# Patient Record
Sex: Male | Born: 1975 | Race: White | Hispanic: No | Marital: Single | Smoking: Never smoker
Health system: Southern US, Community
[De-identification: ages and names within clinical notes are randomized; demographics above are authoritative.]

## PROBLEM LIST (undated history)

## (undated) DIAGNOSIS — I10 Essential (primary) hypertension: Secondary | ICD-10-CM

## (undated) DIAGNOSIS — G43909 Migraine, unspecified, not intractable, without status migrainosus: Secondary | ICD-10-CM

## (undated) HISTORY — PX: JOINT REPLACEMENT: SHX530

## (undated) HISTORY — PX: HIP SURGERY: SHX245

## (undated) HISTORY — PX: CHOLECYSTECTOMY: SHX55

## (undated) HISTORY — PX: ADENOIDECTOMY: SUR15

---

## 2020-04-06 ENCOUNTER — Other Ambulatory Visit: Payer: Self-pay

## 2020-04-06 ENCOUNTER — Emergency Department (HOSPITAL_COMMUNITY)
Admission: EM | Admit: 2020-04-06 | Discharge: 2020-04-06 | Disposition: A | Discharge status: Home / Self Care | Payer: Self-pay | Attending: Emergency Medicine | Admitting: Emergency Medicine

## 2020-04-06 ENCOUNTER — Emergency Department (HOSPITAL_COMMUNITY): Payer: Self-pay

## 2020-04-06 ENCOUNTER — Encounter (HOSPITAL_COMMUNITY): Payer: Self-pay

## 2020-04-06 DIAGNOSIS — W231XXA Caught, crushed, jammed, or pinched between stationary objects, initial encounter: Secondary | ICD-10-CM | POA: Insufficient documentation

## 2020-04-06 DIAGNOSIS — I10 Essential (primary) hypertension: Secondary | ICD-10-CM | POA: Insufficient documentation

## 2020-04-06 DIAGNOSIS — M25532 Pain in left wrist: Secondary | ICD-10-CM | POA: Insufficient documentation

## 2020-04-06 DIAGNOSIS — Y9289 Other specified places as the place of occurrence of the external cause: Secondary | ICD-10-CM | POA: Insufficient documentation

## 2020-04-06 HISTORY — DX: Essential (primary) hypertension: I10

## 2020-04-06 NOTE — ED Triage Notes (Signed)
Pt states he injured left hand at work last Thursday- states it got stuck btwn 2 palettes, no obvious injury or swelling noted- small bump on top of hand. Pt denies numbness or tingling in fingers, work sent him here to be evaluated

## 2020-04-06 NOTE — Discharge Instructions (Addendum)
Continue taking Tylenol ibuprofen as needed at home. Use ice to help with pain and swelling. Use the splint/brace as needed for support, especially if you are using your hand a lot at work. Follow-up with your HR/employee health for further evaluation.  They may want to send you to somebody in particular, but if not, you may follow-up with orthopedic doctor listed below. Return to emergency room for new, worsening, concerning symptoms.

## 2020-04-06 NOTE — Progress Notes (Signed)
Orthopedic Tech Progress Note Patient Details:  Paul Howell 08/25/1975 [id number]  Ortho Devices Type of Ortho Device: Velcro wrist splint Ortho Device/Splint Location: left Ortho Device/Splint Interventions: Application   Post Interventions Patient Tolerated: Well Instructions Provided: Care of device   Saul Fordyce 04/06/2020, 9:22 AM

## 2020-04-06 NOTE — ED Provider Notes (Signed)
[facility]-EMERGENCY DEPT Provider Note   CSN: [id number] Arrival date & time: 04/06/20  0017     History Chief Complaint  Patient presents with  . Hand Pain    Paul Howell is a 45 y.o. male presenting for evaluation of left hand pain and swelling.  Patient states 5 days ago his left hand got stuck between 2 pallets.  He reports acute onset of pain.  Since then, he has had minimal pain when at home resting, but worsening pain when he is at work using his hand and wrist.  Pain is mostly in the dorsal part of his hand/wrist.  No numbness or tingling in his fingers.  No pain in his elbow.  He is taking ibuprofen with minimal improvement of symptoms, has not tried anything else.  He is not on blood thinners.  No injury elsewhere.  As he continued to have severe pain after working last night, he was sent to the ER for evaluation.  He does not have an orthopedic or hand surgeon that he sees in [place].  HPI     Past Medical History:  Diagnosis Date  . Hypertension     There are no problems to display for this patient.   Past Surgical History:  Procedure Laterality Date  . JOINT REPLACEMENT         No family history on file.  Social History   Tobacco Use  . Smoking status: Never Smoker  . Smokeless tobacco: Never Used  Substance Use Topics  . Alcohol use: Not Currently  . Drug use: Never    Home Medications Prior to Admission medications   Not on File    Allergies    Patient has no known allergies.  Review of Systems   Review of Systems  Musculoskeletal: Positive for arthralgias and joint swelling.  Neurological: Negative for numbness.    Physical Exam Updated Vital Signs BP (!) 151/94 (BP Location: Left Arm)   Pulse 70   Temp 97.7 F (36.5 C) (Oral)   Resp 18   Ht 6\' 1"  (1.854 m)   Wt 122.5 kg   SpO2 99%   BMI 35.62 kg/m   Physical Exam Vitals and nursing note reviewed.  Constitutional:      General: He is not  in acute distress.    Appearance: He is well-developed.  HENT:     Head: Normocephalic and atraumatic.  Pulmonary:     Effort: Pulmonary effort is normal.  Abdominal:     General: There is no distension.  Musculoskeletal:        General: Swelling and tenderness present. Normal range of motion.     Cervical back: Normal range of motion.     Comments: Minimal swelling noted of the dorsal left hand at the base of the metacarpals and over the carpals.  No contusion or erythema of the skin.  Patient with full active range of motion of the wrist with minimal pain and full active range of motion of all fingers with pain.  Good distal sensation and cap refill of all fingers.  No pain at the anatomic snuffbox.  No pain of the forearm or elbow.  Radial pulse 2+.  Skin:    General: Skin is warm.     Findings: No rash.  Neurological:     Mental Status: He is alert and oriented to person, place, and time.     ED Results / Procedures / Treatments   Labs (all labs ordered are  listed, but only abnormal results are displayed) Labs Reviewed - No data to display  EKG None  Radiology DG Wrist Complete Left  Result Date: 04/06/2020 CLINICAL DATA:  Injury.  Pain and swelling. EXAM: LEFT WRIST - COMPLETE 3+ VIEW COMPARISON:  No prior. FINDINGS: Radiocarpal degenerative change. Tiny corticated well-circumscribed cysts noted in the distal ulna and lunate, these may be degenerative. Mild cortical thickening noted of the left fifth metacarpal, possibly related to old injury. No acute abnormality. No evidence of acute fracture or dislocation. IMPRESSION: Degenerative changes left wrist. No acute bony or joint abnormality. Electronically Signed   By: Maisie Fus  Register   On: 04/06/2020 08:47   DG Hand Complete Left  Result Date: 04/06/2020 CLINICAL DATA:  Pain and swelling after hand caught between heavy objects EXAM: LEFT HAND - COMPLETE 3+ VIEW COMPARISON:  None. FINDINGS: Frontal, oblique, and lateral views  were obtained. No fracture or dislocation. Joint spaces appear unremarkable. No erosive changes. IMPRESSION: No fracture or dislocation.  No evident arthropathy. Electronically Signed   By: Bretta Bang III M.D.   On: 04/06/2020 08:46    Procedures Procedures   Medications Ordered in ED Medications - No data to display  ED Course  I have reviewed the triage vital signs and the nursing notes.  Pertinent labs & imaging results that were available during my care of the patient were reviewed by me and considered in my medical decision making (see chart for details).    MDM Rules/Calculators/A&P                          Patient presented for evaluation of left hand pain and swelling.  On exam, patient appears nontoxic.  He is neurovascular intact.  While incident occurred several days ago, he continues to have pain, as such, obtain x-ray to ensure no fracture dislocation, although likely MSK based on exam.  X-rays viewed interpreted by me, no fracture dislocation.  Discussed findings with patient.  Discussed continued treatment with NSAIDs and Tylenol.  Will place in for splint to help with pain.  Encourage follow-up if symptoms are not improving.  At this time, patient appears safe for discharge.  Return precautions given.  Patient states he understands and agrees to plan.  Final Clinical Impression(s) / ED Diagnoses Final diagnoses:  None    Rx / DC Orders ED Discharge Orders    None       Alveria Apley, PA-C 04/06/20 0920    Linwood Dibbles, MD 04/07/20 [phone]

## 2020-05-03 ENCOUNTER — Encounter (HOSPITAL_COMMUNITY): Payer: Self-pay

## 2020-05-03 ENCOUNTER — Emergency Department (HOSPITAL_COMMUNITY)
Admission: EM | Admit: 2020-05-03 | Discharge: 2020-05-03 | Disposition: A | Discharge status: Home / Self Care | Payer: Self-pay | Attending: Emergency Medicine | Admitting: Emergency Medicine

## 2020-05-03 ENCOUNTER — Other Ambulatory Visit: Payer: Self-pay

## 2020-05-03 ENCOUNTER — Emergency Department (HOSPITAL_COMMUNITY): Payer: Self-pay

## 2020-05-03 DIAGNOSIS — I10 Essential (primary) hypertension: Secondary | ICD-10-CM | POA: Insufficient documentation

## 2020-05-03 DIAGNOSIS — R5383 Other fatigue: Secondary | ICD-10-CM | POA: Insufficient documentation

## 2020-05-03 DIAGNOSIS — R42 Dizziness and giddiness: Secondary | ICD-10-CM | POA: Insufficient documentation

## 2020-05-03 DIAGNOSIS — H539 Unspecified visual disturbance: Secondary | ICD-10-CM | POA: Insufficient documentation

## 2020-05-03 DIAGNOSIS — R519 Headache, unspecified: Secondary | ICD-10-CM | POA: Insufficient documentation

## 2020-05-03 HISTORY — DX: Migraine, unspecified, not intractable, without status migrainosus: G43.909

## 2020-05-03 LAB — CBC WITH DIFFERENTIAL/PLATELET
Abs Immature Granulocytes: 0.01 10*3/uL (ref 0.00–0.07)
Basophils Absolute: 0.1 10*3/uL (ref 0.0–0.1)
Basophils Relative: 1 %
Eosinophils Absolute: 0.3 10*3/uL (ref 0.0–0.5)
Eosinophils Relative: 4 %
HCT: 45.3 % (ref 39.0–52.0)
Hemoglobin: 15 g/dL (ref 13.0–17.0)
Immature Granulocytes: 0 %
Lymphocytes Relative: 41 %
Lymphs Abs: 2.6 10*3/uL (ref 0.7–4.0)
MCH: 30.3 pg (ref 26.0–34.0)
MCHC: 33.1 g/dL (ref 30.0–36.0)
MCV: 91.5 fL (ref 80.0–100.0)
Monocytes Absolute: 0.4 10*3/uL (ref 0.1–1.0)
Monocytes Relative: 6 %
Neutro Abs: 3 10*3/uL (ref 1.7–7.7)
Neutrophils Relative %: 48 %
Platelets: 224 10*3/uL (ref 150–400)
RBC: 4.95 MIL/uL (ref 4.22–5.81)
RDW: 12.3 % (ref 11.5–15.5)
WBC: 6.3 10*3/uL (ref 4.0–10.5)
nRBC: 0 % (ref 0.0–0.2)

## 2020-05-03 LAB — RAPID URINE DRUG SCREEN, HOSP PERFORMED
Amphetamines: NOT DETECTED
Barbiturates: NOT DETECTED
Benzodiazepines: NOT DETECTED
Cocaine: NOT DETECTED
Opiates: NOT DETECTED
Tetrahydrocannabinol: NOT DETECTED

## 2020-05-03 LAB — URINALYSIS, ROUTINE W REFLEX MICROSCOPIC
Bilirubin Urine: NEGATIVE
Glucose, UA: NEGATIVE mg/dL
Hgb urine dipstick: NEGATIVE
Ketones, ur: NEGATIVE mg/dL
Leukocytes,Ua: NEGATIVE
Nitrite: NEGATIVE
Protein, ur: NEGATIVE mg/dL
Specific Gravity, Urine: 1.017 (ref 1.005–1.030)
pH: 7 (ref 5.0–8.0)

## 2020-05-03 LAB — COMPREHENSIVE METABOLIC PANEL
ALT: 20 U/L (ref 0–44)
AST: 15 U/L (ref 15–41)
Albumin: 4.6 g/dL (ref 3.5–5.0)
Alkaline Phosphatase: 60 U/L (ref 38–126)
Anion gap: 7 (ref 5–15)
BUN: 23 mg/dL — ABNORMAL HIGH (ref 6–20)
CO2: 28 mmol/L (ref 22–32)
Calcium: 9.6 mg/dL (ref 8.9–10.3)
Chloride: 102 mmol/L (ref 98–111)
Creatinine, Ser: 0.93 mg/dL (ref 0.61–1.24)
GFR, Estimated: >60 mL/min (ref >60)
Glucose, Bld: 114 mg/dL — ABNORMAL HIGH (ref 70–99)
Potassium: 4.3 mmol/L (ref 3.5–5.1)
Sodium: 137 mmol/L (ref 135–145)
Total Bilirubin: 0.5 mg/dL (ref 0.3–1.2)
Total Protein: 8.1 g/dL (ref 6.5–8.1)

## 2020-05-03 LAB — ETHANOL: Alcohol, Ethyl (B): <10 mg/dL (ref <10)

## 2020-05-03 MED ORDER — KETOROLAC TROMETHAMINE 30 MG/ML IJ SOLN
30.0000 mg | Freq: Once | INTRAMUSCULAR | Status: DC
  Filled 2020-05-03: qty 1

## 2020-05-03 MED ORDER — KETOROLAC TROMETHAMINE 60 MG/2ML IM SOLN
60.0000 mg | Freq: Once | INTRAMUSCULAR | Status: AC
  Administered 2020-05-03: 60 mg via INTRAMUSCULAR
  Filled 2020-05-03: qty 2

## 2020-05-03 MED ORDER — METOCLOPRAMIDE HCL 5 MG/ML IJ SOLN
10.0000 mg | Freq: Once | INTRAMUSCULAR | Status: DC
  Filled 2020-05-03: qty 2

## 2020-05-03 MED ORDER — PROCHLORPERAZINE EDISYLATE 10 MG/2ML IJ SOLN
10.0000 mg | Freq: Once | INTRAMUSCULAR | Status: AC
  Administered 2020-05-03: 10 mg via INTRAMUSCULAR
  Filled 2020-05-03: qty 2

## 2020-05-03 MED ORDER — DIPHENHYDRAMINE HCL 50 MG/ML IJ SOLN
25.0000 mg | Freq: Once | INTRAMUSCULAR | Status: AC
  Administered 2020-05-03: 25 mg via INTRAMUSCULAR
  Filled 2020-05-03: qty 1

## 2020-05-03 MED ORDER — LORAZEPAM 2 MG/ML IJ SOLN
1.0000 mg | Freq: Once | INTRAMUSCULAR | Status: DC | PRN
Start: 2020-05-03 — End: 2020-05-04

## 2020-05-03 MED ORDER — DIPHENHYDRAMINE HCL 50 MG/ML IJ SOLN
25.0000 mg | Freq: Once | INTRAMUSCULAR | Status: DC
  Filled 2020-05-03: qty 1

## 2020-05-03 NOTE — ED Provider Notes (Signed)
[facility]-EMERGENCY DEPT Provider Note   CSN: [id number] Arrival date & time: 05/03/20  1333     History Chief Complaint  Patient presents with  . Near Syncope    Paul Howell is a 45 y.o. male.  HPI Patient reports he had several episodes of a visual anomaly that started about 2 weeks ago.  He reports in association with this he generally experiences the lightheadedness sensation and then sometimes extreme fatigue.  Today the episode was followed by a frontal headache.  Patient reports about 2 weeks ago he was at work and he experienced this phenomenon whereby his vision seemed to be almost stuttering like a camera shutter.  He reports it was not as if he had double vision, it was more like the vision was almost coming and going very rapidly.  He did not prevent him from actually seeing.  He reports the first time it happened he just tried to work through it and it lasted for almost an hour.  He reports he felt lightheaded during the episode.  He did not go on to have a syncopal episode.  He reports once it stopped, all the symptoms were resolved.  Second time it happened, the visual phenomenon was fairly brief, 1 to 2 minutes, then he had some lightheadedness for a while followed by some really significant fatigue.  Again, once his symptoms resolved they were completely gone.  He reports today he experienced the visual phenomenon again and again this was just for a few minutes, followed by extreme fatigue and frontal headache.  Patient does report that about 2 weeks ago just before the onset of the symptoms, he had a fentanyl overdose.  He reports he was told there was a period of time that he was probably anoxic.  He had to have a couple rounds of Narcan.  He is not sure if this is associated with the problem he is experiencing.  He reports now he feels like the symptoms are better if he breathes deeply and brings in more oxygen.  Patient has not had any drug use  since the overdose.  He is not drinking any alcohol.    Past Medical History:  Diagnosis Date  . Hypertension   . Migraine     There are no problems to display for this patient.   Past Surgical History:  Procedure Laterality Date  . ADENOIDECTOMY    . CHOLECYSTECTOMY    . HIP SURGERY Left   . JOINT REPLACEMENT         Family History  Problem Relation Age of Onset  . Hypertension Mother   . Heart attack Father     Social History   Tobacco Use  . Smoking status: Never Smoker  . Smokeless tobacco: Never Used  Vaping Use  . Vaping Use: Never used  Substance Use Topics  . Alcohol use: Not Currently  . Drug use: Never    Home Medications Prior to Admission medications   Not on File    Allergies    Patient has no known allergies.  Review of Systems   Review of Systems 10 systems reviewed and negative except as per HPI Physical Exam Updated Vital Signs BP 139/84   Pulse (!) 53   Temp 97.6 F (36.4 C) (Oral)   Resp 17   Ht 6\' 1"  (1.854 m)   Wt 122.5 kg   SpO2 98%   BMI 35.62 kg/m   Physical Exam Constitutional:  Comments: Alert, nontoxic clinically well in appearance.  No respiratory distress.  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  This has a about 1 cm larger, soft tumor on the lower lip.  There is some involvement of the tongue with asymmetric appearance.    Ears:     Comments: Right TM is narrow, angulated and cerumen impacted.  Left TM small amount of cerumen TM only partially visualized.    Mouth/Throat:     Comments: Dentition is in poor condition with significant decay.  Patient has a tongue deviation to the left with asymmetric size of the tongue reported as baseline with associated birthmark.  Voice is clear.  Posterior oropharynx is patent. Eyes:     Extraocular Movements: Extraocular movements  intact.     Conjunctiva/sclera: Conjunctivae normal.     Pupils: Pupils are equal, round, and reactive to light.     Comments: Eyes normal appearance.  Extraocular motions intact.  Symmetric pupillary responses with normal consensual response.  No periorbital swelling or injection.  Cardiovascular:     Rate and Rhythm: Normal rate.  Pulmonary:     Effort: Pulmonary effort is normal.     Breath sounds: Normal breath sounds.  Abdominal:     General: There is no distension.     Palpations: Abdomen is soft.  Musculoskeletal:        General: No swelling or tenderness. Normal range of motion.     Cervical back: Neck supple.     Right lower leg: No edema.     Left lower leg: No edema.  Skin:    General: Skin is warm and dry.  Neurological:     General: No focal deficit present.     Mental Status: He is oriented to person, place, and time.     Cranial Nerves: No cranial nerve deficit.     Motor: No weakness.     Coordination: Coordination normal.     Comments: Cognitive function and speech are normal.  CS 15.  Finger-nose exam normal bilaterally.  Motor strength 5\5.  Psychiatric:        Mood and Affect: Mood normal.     ED Results / Procedures / Treatments   Labs (all labs ordered are listed, but only abnormal results are displayed) Labs Reviewed  COMPREHENSIVE METABOLIC PANEL - Abnormal; Notable for the following components:      Result Value   Glucose, Bld 114 (*)    BUN 23 (*)    All other components within normal limits  CBC WITH DIFFERENTIAL/PLATELET  ETHANOL  URINALYSIS, ROUTINE W REFLEX MICROSCOPIC  RAPID URINE DRUG SCREEN, HOSP PERFORMED    EKG None  Radiology CT Head Wo Contrast  Result Date: 05/03/2020 CLINICAL DATA:  Headache, new or worsening, positional. Additional history provided: Patient reports headache, dizziness, vision issues, symptoms for 2 weeks. EXAM: CT HEAD WITHOUT CONTRAST TECHNIQUE: Contiguous axial images were obtained from the base of the skull  through the vertex without intravenous contrast. COMPARISON:  No pertinent prior exams available for comparison. FINDINGS: Brain: Cerebral volume is normal. There is no acute intracranial hemorrhage. No demarcated cortical infarct. No extra-axial fluid collection. No evidence of intracranial mass. No midline shift. Vascular: No hyperdense vessel. Skull: Normal. Negative for fracture or focal lesion. Sinuses/Orbits: Visualized orbits show no acute finding. Mild-to-moderate mucosal  thickening within the inferior frontal sinuses bilaterally. Scattered frothy secretions and mild to moderate mucosal thickening within the bilateral ethmoid air cells. Mild bilateral sphenoid sinus mucosal thickening. Small volume frothy secretions and moderate mucosal thickening within the right maxillary sinus. Small volume frothy secretions and mild mucosal thickening within the left maxillary sinus. Other: Small left mastoid effusion. IMPRESSION: No evidence of acute intracranial abnormality. Pansinusitis, as described. Small left mastoid effusion. Electronically Signed   By: Jackey Loge DO   On: 05/03/2020 16:11    Procedures Procedures   Medications Ordered in ED Medications  LORazepam (ATIVAN) injection 1 mg (has no administration in time range)  ketorolac (TORADOL) injection 60 mg (60 mg Intramuscular Given 05/03/20 1737)  prochlorperazine (COMPAZINE) injection 10 mg (10 mg Intramuscular Given 05/03/20 1739)  diphenhydrAMINE (BENADRYL) injection 25 mg (25 mg Intramuscular Given 05/03/20 1738)    ED Course  I have reviewed the triage vital signs and the nursing notes.  Pertinent labs & imaging results that were available during my care of the patient were reviewed by me and considered in my medical decision making (see chart for details).  Clinical Course as of 05/03/20 Glory Buff May 03, 2020  1758 Consult: Reviewed with Dr. Otelia Limes, recommends MRI with and without contrast.  If negative, follow-up with neurology on  outpatient basis [MP]    Clinical Course User Index [MP] Arby Barrette, MD   MDM Rules/Calculators/A&P                          Recheck 18: 50.  Patient reports feeling much better.  Symptoms are resolved after migraine cocktail.  We reviewed the recommendations for MRI.  At this time MRI is not available at [facility] and would require transfer to [facility].  Discussion with Dr. Otelia Limes indicated this could also be done on outpatient basis.  Patient reports he would prefer to follow-up with neurology and pursue outpatient work-up rather than be transferred to [facility] for further imaging at this time.  He reports symptoms are resolved and he feels much better  Patient presented as outlined.  At this time differential diagnosis includes partial seizure, possibly secondary to anoxic injury at time of patient's overdose.  Also possible migraine.  Patient ports he does have history of migraine headaches.  This could represent ocular migraine with visual symptoms then fatigue and today frontal headache.  Patient is clinically well in appearance.  No signs of infectious etiology.  Symptoms resolved completely once the episode is over.  He does not have residual symptoms fevers chills body aches etc. at this time patient stable for discharge with follow-up plan in place.  He is counseled on the nature of seizure disorder and the necessity for no driving and avoiding all injuries that could result in injury if the seizure occurred.  Patient voices understanding. Final Clinical Impression(s) / ED Diagnoses Final diagnoses:  Visual disturbance  Frontal headache  Dizziness    Rx / DC Orders ED Discharge Orders         Ordered    Ambulatory referral to Neurology       Comments: An appointment is requested in approximately: 1 WEEK   05/03/20 1850           Arby Barrette, MD 05/03/20 Windell Moment

## 2020-05-03 NOTE — Discharge Instructions (Signed)
1.  Follow instructions for possible seizure.  At this time it is unclear if you are having any seizure activity.  You may be having a partial seizure.  No driving or any activities that could result in serious injury if you have a seizure.  This includes things such as climbing on ladders, operating equipment, swimming or other activities that could result in serious injury. 2.  Migraines can have ocular symptoms.  Some migraines have symptoms in the vision without headache.  This may be a migraine.  Try to get plenty of rest at night, stay hydrated, eat healthy, small meals and avoid all drug and alcohol use. 3.  Return to the emergency department if you have any persisting, new or worsening symptoms.  Schedule your follow-up with [facility] as soon as possible.  Must have a follow-up with a neurologist for further evaluation.

## 2020-05-03 NOTE — ED Triage Notes (Signed)
Patient states that he has had 2-3 episodes in he past week that he has had near syncopal episodes. Patient states he "has flashing in his eyes, feeling like he is going to pass out and a headache."  Patient states he has been taking Lisinopril 20 mg daily, but has not had any in a month.

## 2020-05-03 NOTE — ED Notes (Addendum)
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 Pt states that these "episodes" he is having, started after that.

## 2020-05-03 NOTE — ED Triage Notes (Signed)
Emergency Medicine Provider Triage Evaluation Note  Betzalel Umbarger , a 45 y.o. male  was evaluated in triage.  Pt complains of generalized weakness, frontal headache, flashes of lights (described as like a camera shutter going off), at this time reports frontal headache, no unilateral weakness or numbness.  Review of Systems  Positive: Headaches, weakness, visual disturbance  Negative: Numbness, speech or gait disturbance  Physical Exam  BP (!) 134/95 (BP Location: Left Arm)   Pulse 69   Temp 97.6 F (36.4 C) (Oral)   Resp 16   Ht 6\' 1"  (1.854 m)   Wt 122.5 kg   SpO2 99%   BMI 35.62 kg/m  Gen:   Awake, no distress   HEENT:  Atraumatic  Resp:  Normal effort  Cardiac:  Normal rate  Abd:   Nondistended, nontender  MSK:   Moves extremities without difficulty  Neuro:  Speech clear   Medical Decision Making  Medically screening exam initiated at 2:32 PM.  Appropriate orders placed.  was informed that the remainder of the evaluation will be completed by another provider, this initial triage assessment does not replace that evaluation, and the importance of remaining in the ED until their evaluation is complete.  Clinical Impression     Amedeo Kinsman, PA-C 05/03/20 1433

## 2021-12-04 IMAGING — CT CT HEAD W/O CM
4 series · 17 of 47 positions shown, 19 images · non-contrast
Comparison: No pertinent prior exams available for comparison.

CLINICAL DATA: Headache, new or worsening, positional. Additional
history provided: Patient reports headache, dizziness, vision
issues, symptoms for 2 weeks.

EXAM:
CT HEAD WITHOUT CONTRAST
TECHNIQUE: Contiguous axial images were obtained from the base of the skull
through the vertex without intravenous contrast.

[Series 2: head wo · axial · 0.47mm/px · z∈[+1648,+1783]mm · 7 of 37 slices shown, 9 images]
[im 5/37  brain]
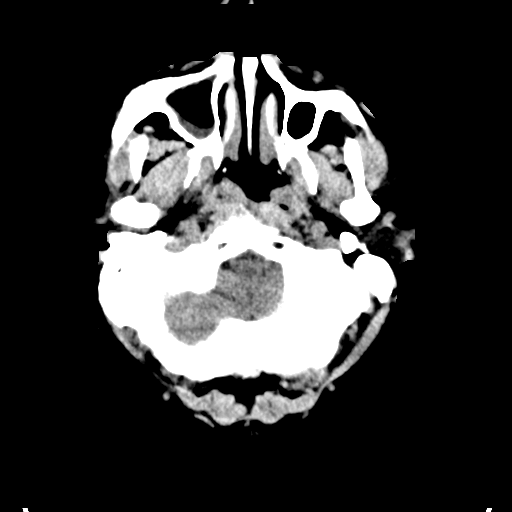
[im 5/37  bone]
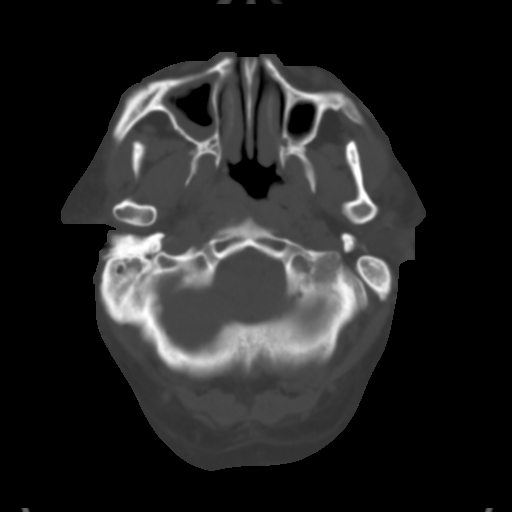
[im 10/37  brain]
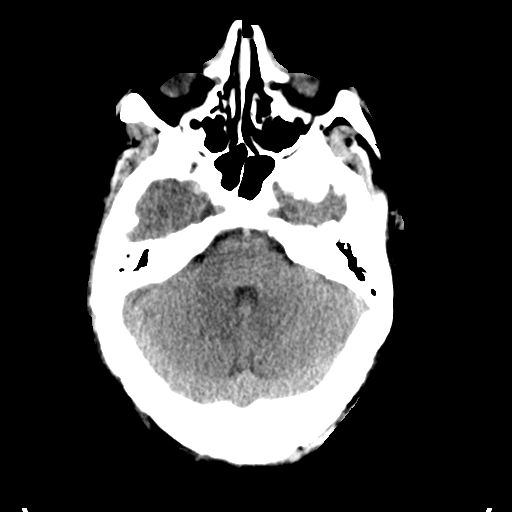
[im 14/37  brain]
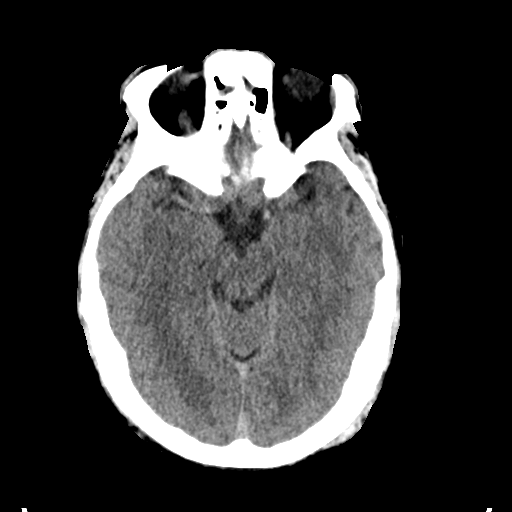
[im 19/37  brain]
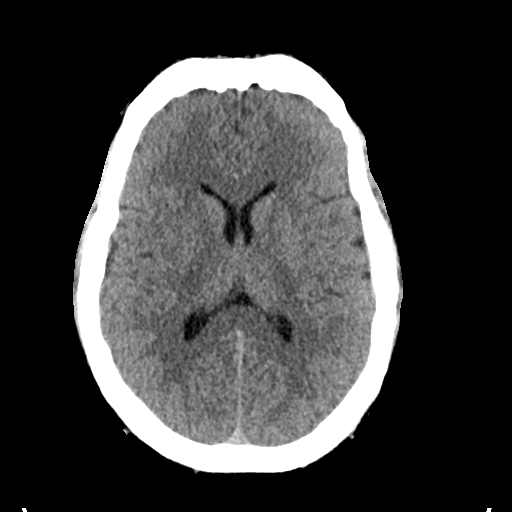
[im 23/37  brain]
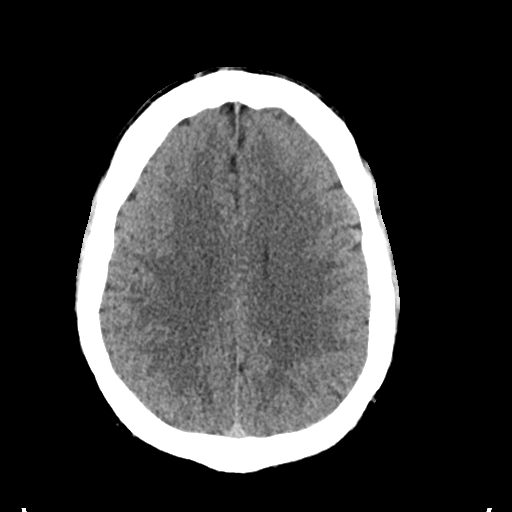
[im 23/37  bone]
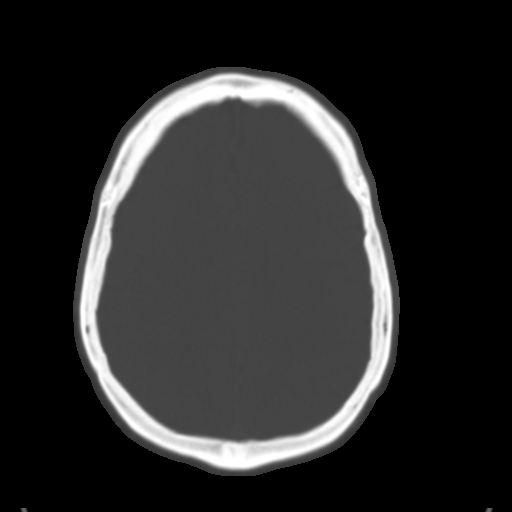
[im 28/37  brain]
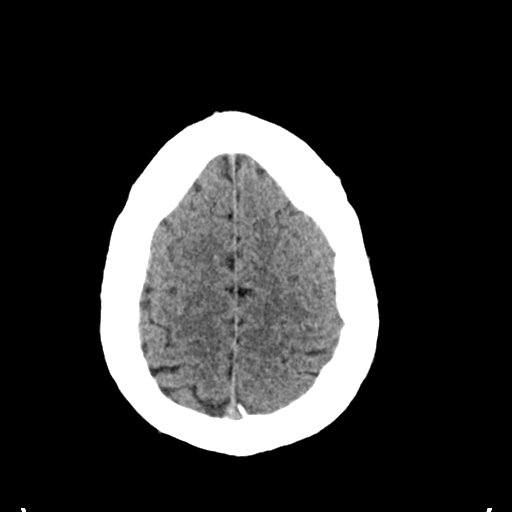
[im 32/37  brain]
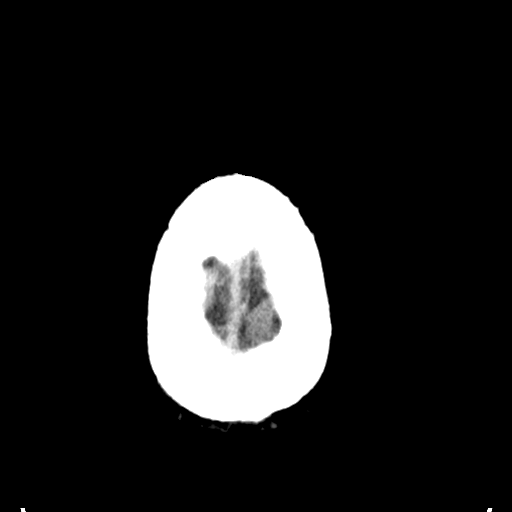

[Series 3: head bone · axial · 0.47mm/px · z∈[+1646,+1708]mm · 4 of 91 slices shown]
[im 10/91  bone]
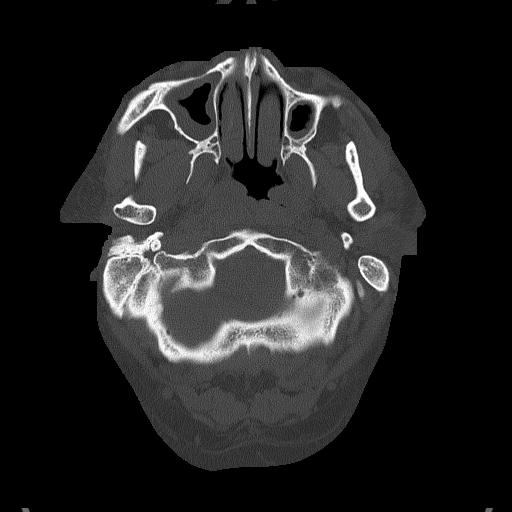
[im 19/91  bone]
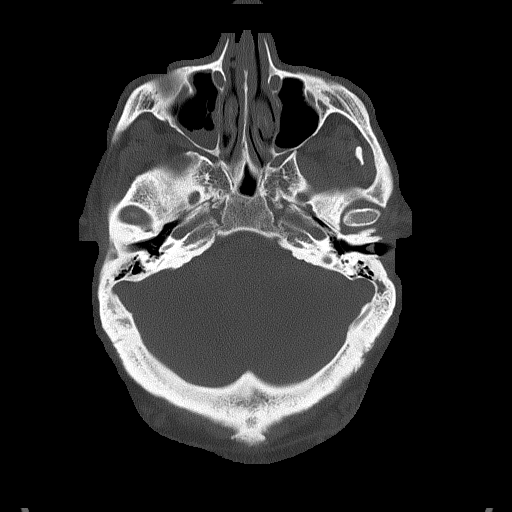
[im 28/91  bone]
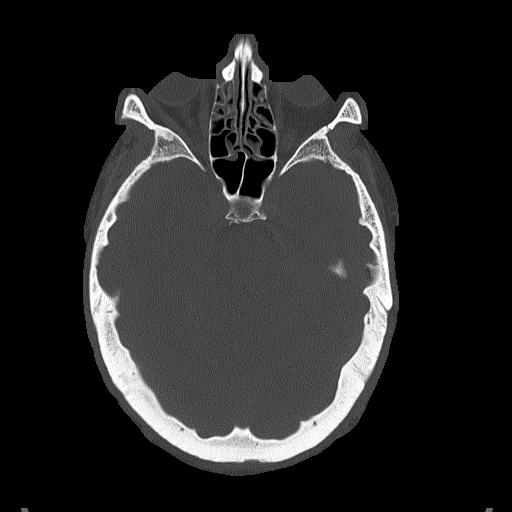
[im 41/91  bone]
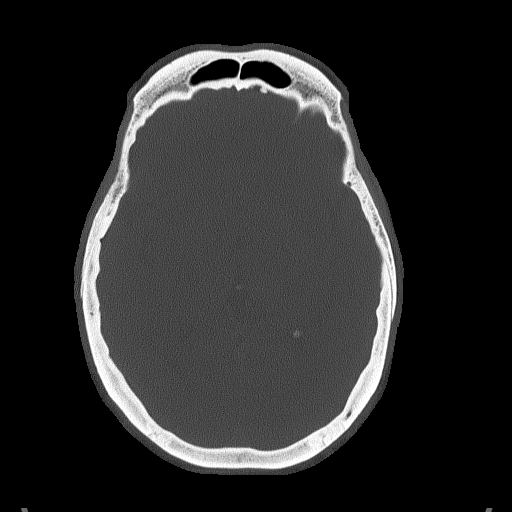

[Series 4: coronal soft tissue · coronal · 0.39mm/px · 3 of 72 slices shown]
[im 24/72  brain]
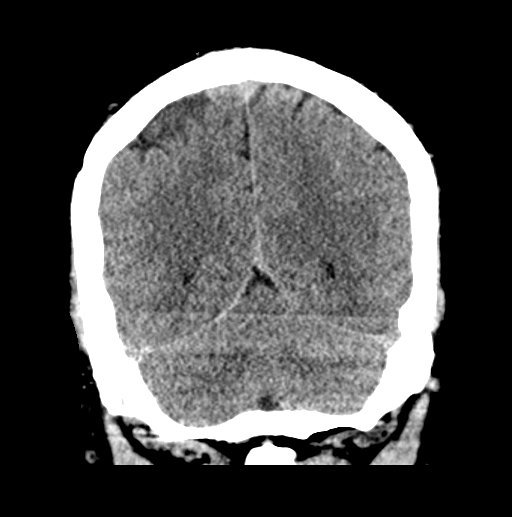
[im 32/72  brain]
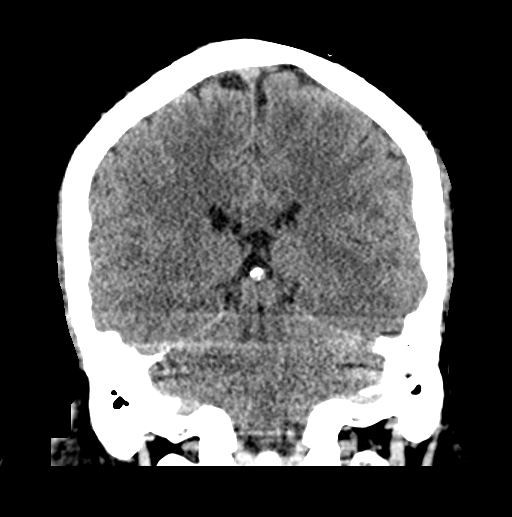
[im 40/72  brain]
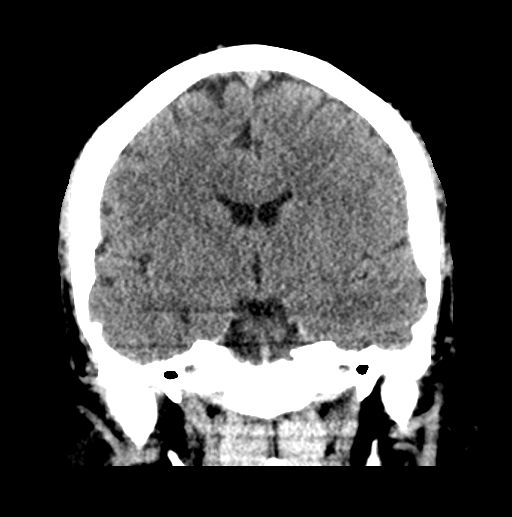

[Series 5: sagittal soft tissue · sagittal · 0.41mm/px · 3 of 63 slices shown]
[im 21/63  brain]
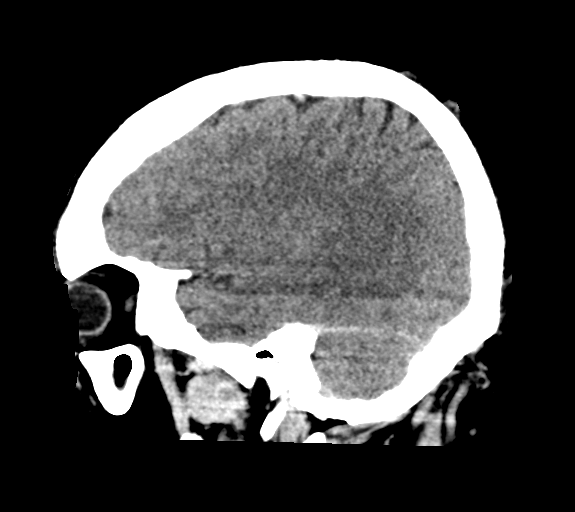
[im 32/63  brain]
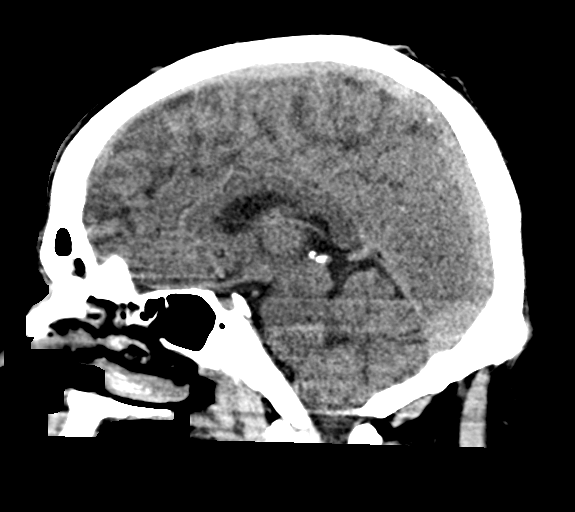
[im 42/63  brain]
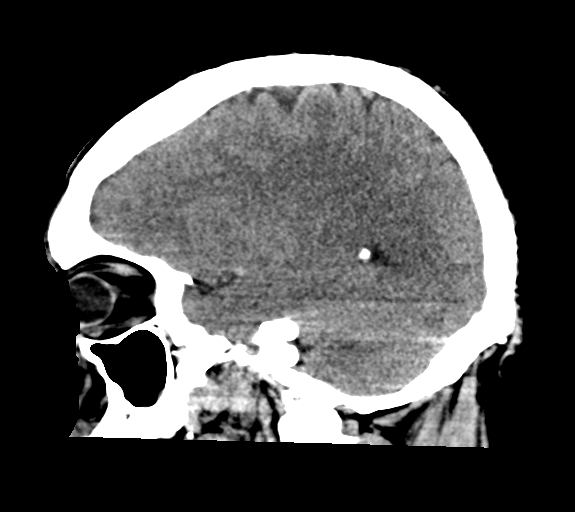

[17 of 47 positions shown; findings below may reference images not displayed]

FINDINGS: Brain:

Cerebral volume is normal.

There is no acute intracranial hemorrhage.

No demarcated cortical infarct.

No extra-axial fluid collection.

No evidence of intracranial mass.

No midline shift.

Vascular: No hyperdense vessel.

Skull: Normal. Negative for fracture or focal lesion.

Sinuses/Orbits: Visualized orbits show no acute finding.
Mild-to-moderate mucosal thickening within the inferior frontal
sinuses bilaterally. Scattered frothy secretions and mild to
moderate mucosal thickening within the bilateral ethmoid air cells.
Mild bilateral sphenoid sinus mucosal thickening. Small volume
frothy secretions and moderate mucosal thickening within the right
maxillary sinus. Small volume frothy secretions and mild mucosal
thickening within the left maxillary sinus.

Other: Small left mastoid effusion.
IMPRESSION: No evidence of acute intracranial abnormality.

Pansinusitis, as described.

Small left mastoid effusion.
